# Patient Record
Sex: Female | Born: 1970 | Race: White | Hispanic: No | Smoking: Never smoker
Health system: Southern US, Community
[De-identification: ages and names within clinical notes are randomized; demographics above are authoritative.]

## PROBLEM LIST (undated history)

## (undated) DIAGNOSIS — R569 Unspecified convulsions: Secondary | ICD-10-CM

## (undated) DIAGNOSIS — F319 Bipolar disorder, unspecified: Secondary | ICD-10-CM

## (undated) HISTORY — PX: OTHER SURGICAL HISTORY: SHX169

## (undated) HISTORY — PX: ABDOMINAL HYSTERECTOMY: SHX81

## (undated) HISTORY — PX: CHOLECYSTECTOMY: SHX55

## (undated) HISTORY — PX: APPENDECTOMY: SHX54

## (undated) HISTORY — PX: OOPHORECTOMY: SHX86

---

## 2015-10-30 ENCOUNTER — Emergency Department
Admission: EM | Admit: 2015-10-30 | Discharge: 2015-10-30 | Disposition: A | Discharge status: Home / Self Care | Payer: BLUE CROSS/BLUE SHIELD | Attending: Student | Admitting: Student

## 2015-10-30 ENCOUNTER — Encounter: Payer: Self-pay | Admitting: Emergency Medicine

## 2015-10-30 ENCOUNTER — Emergency Department: Payer: BLUE CROSS/BLUE SHIELD

## 2015-10-30 DIAGNOSIS — N390 Urinary tract infection, site not specified: Secondary | ICD-10-CM | POA: Diagnosis not present

## 2015-10-30 DIAGNOSIS — R0781 Pleurodynia: Secondary | ICD-10-CM | POA: Insufficient documentation

## 2015-10-30 DIAGNOSIS — Z792 Long term (current) use of antibiotics: Secondary | ICD-10-CM | POA: Diagnosis not present

## 2015-10-30 DIAGNOSIS — R569 Unspecified convulsions: Secondary | ICD-10-CM | POA: Insufficient documentation

## 2015-10-30 DIAGNOSIS — F445 Conversion disorder with seizures or convulsions: Secondary | ICD-10-CM

## 2015-10-30 HISTORY — DX: Bipolar disorder, unspecified: F31.9

## 2015-10-30 HISTORY — DX: Unspecified convulsions: R56.9

## 2015-10-30 LAB — CBC WITH DIFFERENTIAL/PLATELET
BASOS ABS: 0.1 10*3/uL (ref 0–0.1)
BASOS PCT: 1 %
EOS ABS: 0.2 10*3/uL (ref 0–0.7)
EOS PCT: 2 %
HCT: 35.7 % (ref 35.0–47.0)
Hemoglobin: 12 g/dL (ref 12.0–16.0)
LYMPHS PCT: 24 %
Lymphs Abs: 2.3 10*3/uL (ref 1.0–3.6)
MCH: 29.7 pg (ref 26.0–34.0)
MCHC: 33.6 g/dL (ref 32.0–36.0)
MCV: 88.5 fL (ref 80.0–100.0)
Monocytes Absolute: 0.7 10*3/uL (ref 0.2–0.9)
Monocytes Relative: 7 %
Neutro Abs: 6.6 10*3/uL — ABNORMAL HIGH (ref 1.4–6.5)
Neutrophils Relative %: 66 %
PLATELETS: 329 10*3/uL (ref 150–440)
RBC: 4.03 MIL/uL (ref 3.80–5.20)
RDW: 14.5 % (ref 11.5–14.5)
WBC: 9.8 10*3/uL (ref 3.6–11.0)

## 2015-10-30 LAB — COMPREHENSIVE METABOLIC PANEL
ALBUMIN: 4.4 g/dL (ref 3.5–5.0)
ALT: 39 U/L (ref 14–54)
AST: 22 U/L (ref 15–41)
Alkaline Phosphatase: 158 U/L — ABNORMAL HIGH (ref 38–126)
Anion gap: 10 (ref 5–15)
BUN: 15 mg/dL (ref 6–20)
CHLORIDE: 106 mmol/L (ref 101–111)
CO2: 21 mmol/L — AB (ref 22–32)
CREATININE: 1.06 mg/dL — AB (ref 0.44–1.00)
Calcium: 10 mg/dL (ref 8.9–10.3)
GFR calc Af Amer: >60 mL/min (ref >60)
GFR calc non Af Amer: >60 mL/min (ref >60)
GLUCOSE: 108 mg/dL — AB (ref 65–99)
Potassium: 4 mmol/L (ref 3.5–5.1)
SODIUM: 137 mmol/L (ref 135–145)
Total Bilirubin: 0.2 mg/dL — ABNORMAL LOW (ref 0.3–1.2)
Total Protein: 7.5 g/dL (ref 6.5–8.1)

## 2015-10-30 LAB — URINALYSIS COMPLETE WITH MICROSCOPIC (ARMC ONLY)
Bilirubin Urine: NEGATIVE
Glucose, UA: NEGATIVE mg/dL
Ketones, ur: NEGATIVE mg/dL
Nitrite: NEGATIVE
PH: 6 (ref 5.0–8.0)
Protein, ur: 30 mg/dL — AB
SPECIFIC GRAVITY, URINE: 1.01 (ref 1.005–1.030)

## 2015-10-30 MED ORDER — TRAMADOL HCL 50 MG PO TABS: 50.0000 mg | ORAL_TABLET | Freq: Three times a day (TID) | ORAL | Status: AC | PRN

## 2015-10-30 MED ORDER — NITROFURANTOIN MONOHYD MACRO 100 MG PO CAPS
100.0000 mg | ORAL_CAPSULE | Freq: Once | ORAL | Status: AC
  Administered 2015-10-30: 100 mg via ORAL

## 2015-10-30 MED ORDER — NITROFURANTOIN MONOHYD MACRO 100 MG PO CAPS: 100.0000 mg | ORAL_CAPSULE | Freq: Two times a day (BID) | ORAL | Status: AC

## 2015-10-30 NOTE — ED Notes (Signed)
Mobile crisis person with her was called out because police found her disoriented sitting on a porch. States at that time was disoriented. Patient states she normally has a several hour post ictal time which is why she thinks she has a seizure.

## 2015-10-30 NOTE — ED Provider Notes (Addendum)
[facility] Emergency Department Provider Note  ____________________________________________  Time seen: Approximately 9:30 PM  I have reviewed the triage vital signs and the nursing notes.   HISTORY  Chief Complaint Seizures    HPI Isabella Gregory is a 45 y.o. 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 She reports that she has these episodes every few months and they are brought on by severe stress. She reports that she is under "the most stress I have ever been under". She reports she has had dysuria and is concerned she might have a urinary tract infection. No fevers or chills. No abdominal pain, vomiting or diarrhea. She reports that 2 weeks ago she suffered mechanical fall down a few steps and has been having right-sided chest pain which is been constant since then, is worse with movement. She is concerned for possible broken rib.   Past Medical History  Diagnosis Date  . Seizures (HCC)   . Bipolar 1 disorder (HCC)     There are no active problems to display for this patient.   Past Surgical History  Procedure Laterality Date  . Cholecystectomy    . Appendectomy    . Oophorectomy      bilateral  . Rectum resection    . Abdominal hysterectomy      Current Outpatient Rx  Name  Route  Sig  Dispense  Refill  . nitrofurantoin, macrocrystal-monohydrate, (MACROBID) 100 MG capsule   Oral   Take 1 capsule (100 mg total) by mouth 2 (two) times daily.   14 capsule   0   . traMADol (ULTRAM) 50 MG tablet   Oral   Take 1 tablet (50 mg total) by mouth every 8 (eight) hours as needed for severe pain.   6 tablet   0     Allergies Contrast media and Morphine and related  No family history on file.  Social  History Social History  Substance Use Topics  . Smoking status: Never Smoker   . Smokeless tobacco: None  . Alcohol Use: No    Review of Systems Constitutional: No fever/chills Eyes: No visual changes. ENT: No sore throat. Cardiovascular: Denies chest pain. Respiratory: Denies shortness of breath. Gastrointestinal: No abdominal pain.  No nausea, no vomiting.  No diarrhea.  No constipation. Genitourinary: Positive for dysuria. Musculoskeletal: Negative for back pain. Skin: Negative for rash. Neurological: Negative for headaches, focal weakness or numbness.  10-point ROS otherwise negative.  ____________________________________________   PHYSICAL EXAM:  VITAL SIGNS: ED Triage Vitals  Enc Vitals Group     BP 10/30/15 1726 110/76 mmHg     Pulse Rate 10/30/15 1726 109     Resp 10/30/15 1726 20     Temp 10/30/15 1726 98.3 F (36.8 C)     Temp Source 10/30/15 1726 Oral     SpO2 10/30/15 1726 97 %     Weight 10/30/15 1726 180 lb (81.647 kg)     Height 10/30/15 1726 5\' 6"  (1.676 m)     Head Cir --      Peak Flow --      Pain Score 10/30/15 1727 9     Pain Loc --      Pain Edu? --      Excl. in GC? --     Constitutional: Alert and oriented. Well appearing  and in no acute distress. Eyes: Conjunctivae are normal. PERRL. EOMI. Head: Atraumatic. Nose: No congestion/rhinnorhea. Mouth/Throat: Mucous membranes are moist.  Oropharynx non-erythematous. Neck: No stridor.   Cardiovascular: Normal rate, regular rhythm. Grossly normal heart sounds.  Good peripheral circulation. Respiratory: Normal respiratory effort.  No retractions. Lungs CTAB. Gastrointestinal: Soft and nontender. No distention.  No CVA tenderness. Genitourinary: deferred Musculoskeletal: No lower extremity tenderness nor edema.  No joint effusions. Result tenderness to palpation throat the right lateral chest wall. No bony step-off or deformity. No flail chest. No calf asymmetry/tenderness/edema. Neurologic:   Normal speech and language. No gross focal neurologic deficits are appreciated. No gait instability. 5 out of 5 strength in bilateral upper and lower extremities, sensation intact to light touch throughout. Skin:  Skin is warm, dry and intact. No rash noted. Psychiatric: Mood and affect are normal. Speech and behavior are normal.  ____________________________________________   LABS (all labs ordered are listed, but only abnormal results are displayed)  Labs Reviewed  CBC WITH DIFFERENTIAL/PLATELET - Abnormal; Notable for the following:    Neutro Abs 6.6 (*)    All other components within normal limits  COMPREHENSIVE METABOLIC PANEL - Abnormal; Notable for the following:    CO2 21 (*)    Glucose, Bld 108 (*)    Creatinine, Ser 1.06 (*)    Alkaline Phosphatase 158 (*)    Total Bilirubin 0.2 (*)    All other components within normal limits  URINALYSIS COMPLETEWITH MICROSCOPIC ([facility] ONLY) - Abnormal; Notable for the following:    Color, Urine YELLOW (*)    APPearance CLOUDY (*)    Hgb urine dipstick 1+ (*)    Protein, ur 30 (*)    Leukocytes, UA 3+ (*)    Bacteria, UA MANY (*)    Squamous Epithelial / LPF 6-30 (*)    All other components within normal limits  LAMOTRIGINE LEVEL   ____________________________________________  EKG  ED ECG REPORT I, Gayla Doss, the attending physician, personally viewed and interpreted this ECG.   Date: 10/30/2015  EKG Time: 21:24  Rate: 88  Rhythm: normal sinus rhythm  Axis: normal  Intervals:none  ST&T Change: No acute ST elevation. Normal QTC.  ____________________________________________  RADIOLOGY  CXR  IMPRESSION: No significant acute findings  ____________________________________________   PROCEDURES  Procedure(s) performed: None  Critical Care performed: No  ____________________________________________   INITIAL IMPRESSION / ASSESSMENT AND PLAN / ED COURSE  Pertinent labs & imaging results that were available  during my care of the patient were reviewed by me and considered in my medical decision making (see chart for details).  Isabella Gregory is a 45 y.o. 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 On exam, she is very well-appearing and in no acute distress, lying in bed and comfortably watching tv. She was mildly tachycardic on arrival to triage however that has resolved without any intervention at the time of my assessment. She has an intact neurological examination, she is alert and oriented 3. She does have reproducible right chest wall pain that she reports began when she fell down some steps 2 weeks ago. Chest x-ray shows no acute findings. EKG is reassuring. Doubt ACS. She denies any hemoptysis, leg swelling, use of oral contraceptives, recent surgeries, has no clinical evidence of DVT and her pain began in the setting of trauma so I doubt PE. Not consistent  with acute aortic dissection. CMP is notable for very mild creatinine elevation at 1.06. Patient encouraged to push by mouth fluids. She also has a mild nonspecific elevation of her alkaline phosphatase at 158. CMP unremarkable. Urinalysis is concerning for urinary tract infection. We'll start Macrobid. We discussed return precautions, need for close PCP follow-up and she is comfortable with the discharge plan. I discharged her with Macrobid as well as #6 tramadol tablets for breakthrough pain in the chest wall. DC home. ____________________________________________   FINAL CLINICAL IMPRESSION(S) / ED DIAGNOSES  Final diagnoses:  UTI (lower urinary tract infection)  Rib pain on right side  Pseudoseizure (HCC)      Gayla Doss, MD 10/30/15 2209  Gayla Doss, MD 10/30/15 2222

## 2015-10-30 NOTE — ED Notes (Signed)
Seizure today, history of seizures, has not had one in a year however under a lot of stress and lack of sleep which is a trigger for her. Also fell 2 weeks ago with chest wall pain.

## 2015-10-30 NOTE — Discharge Instructions (Signed)
If your neurologist told you that you have organic seizures, you should not drive until cleared to do so by your neurologist. Please take Macrobid as prescribed for urinary tract infection and follow up with your primary care doctor soon as possible. Return to the emergency department if you have seizure-like activity, fevers, abdominal pain, worsening chest pain or change in chest pain, difficulty breathing, cough, fevers, or for any other concerns.

## 2015-10-31 LAB — LAMOTRIGINE LEVEL: LAMOTRIGINE LVL: NOT DETECTED ug/mL (ref 2.0–20.0)

## 2017-01-05 IMAGING — CR DG CHEST 2V
1 series · 2 of 2 positions shown · non-contrast
Comparison: None.

CLINICAL DATA: Fall, chest pain

EXAM:
CHEST  2 VIEW

[Series 1: w chest pa · 0.14mm/px · 2 of 2 slices shown]
[im 1/2]
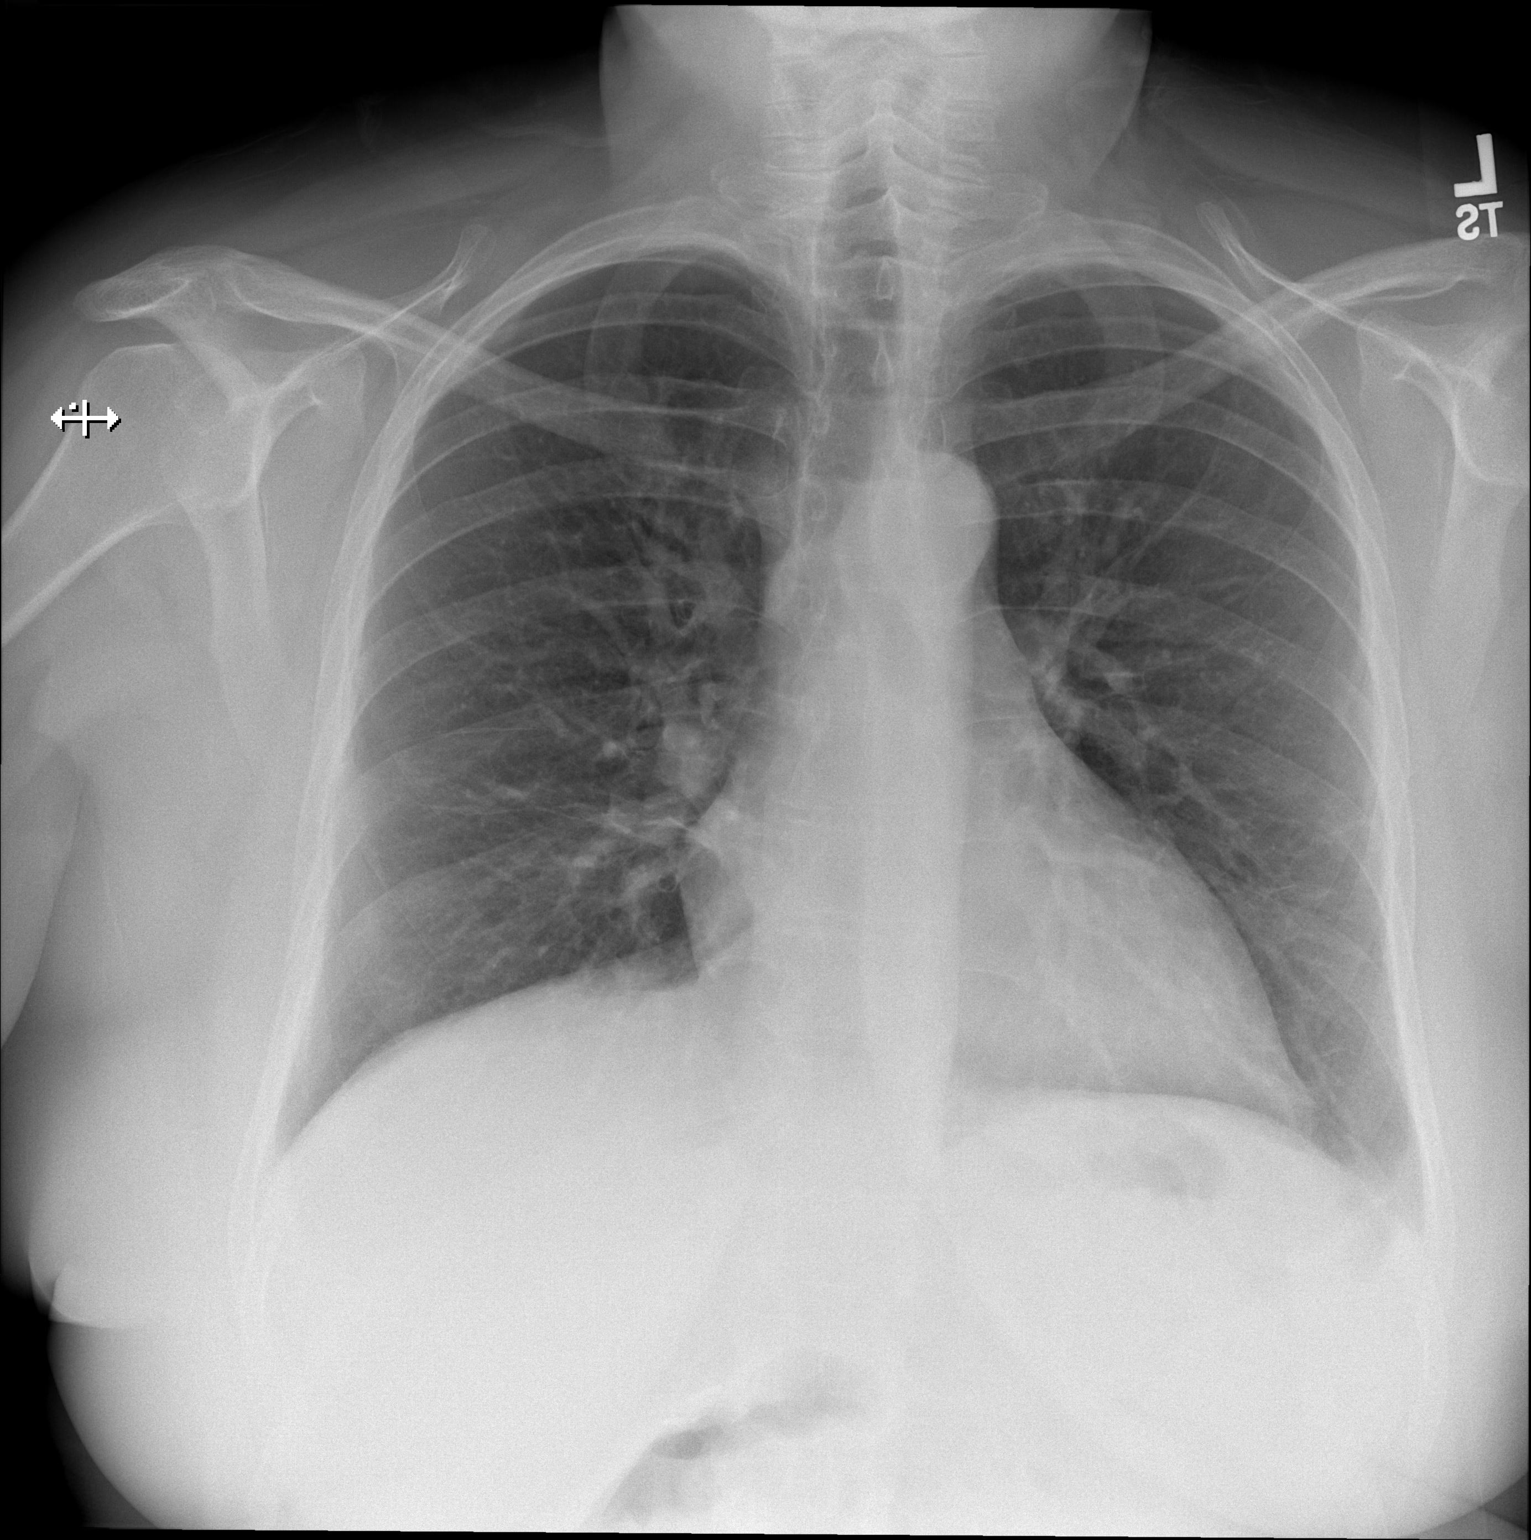
[im 2/2]
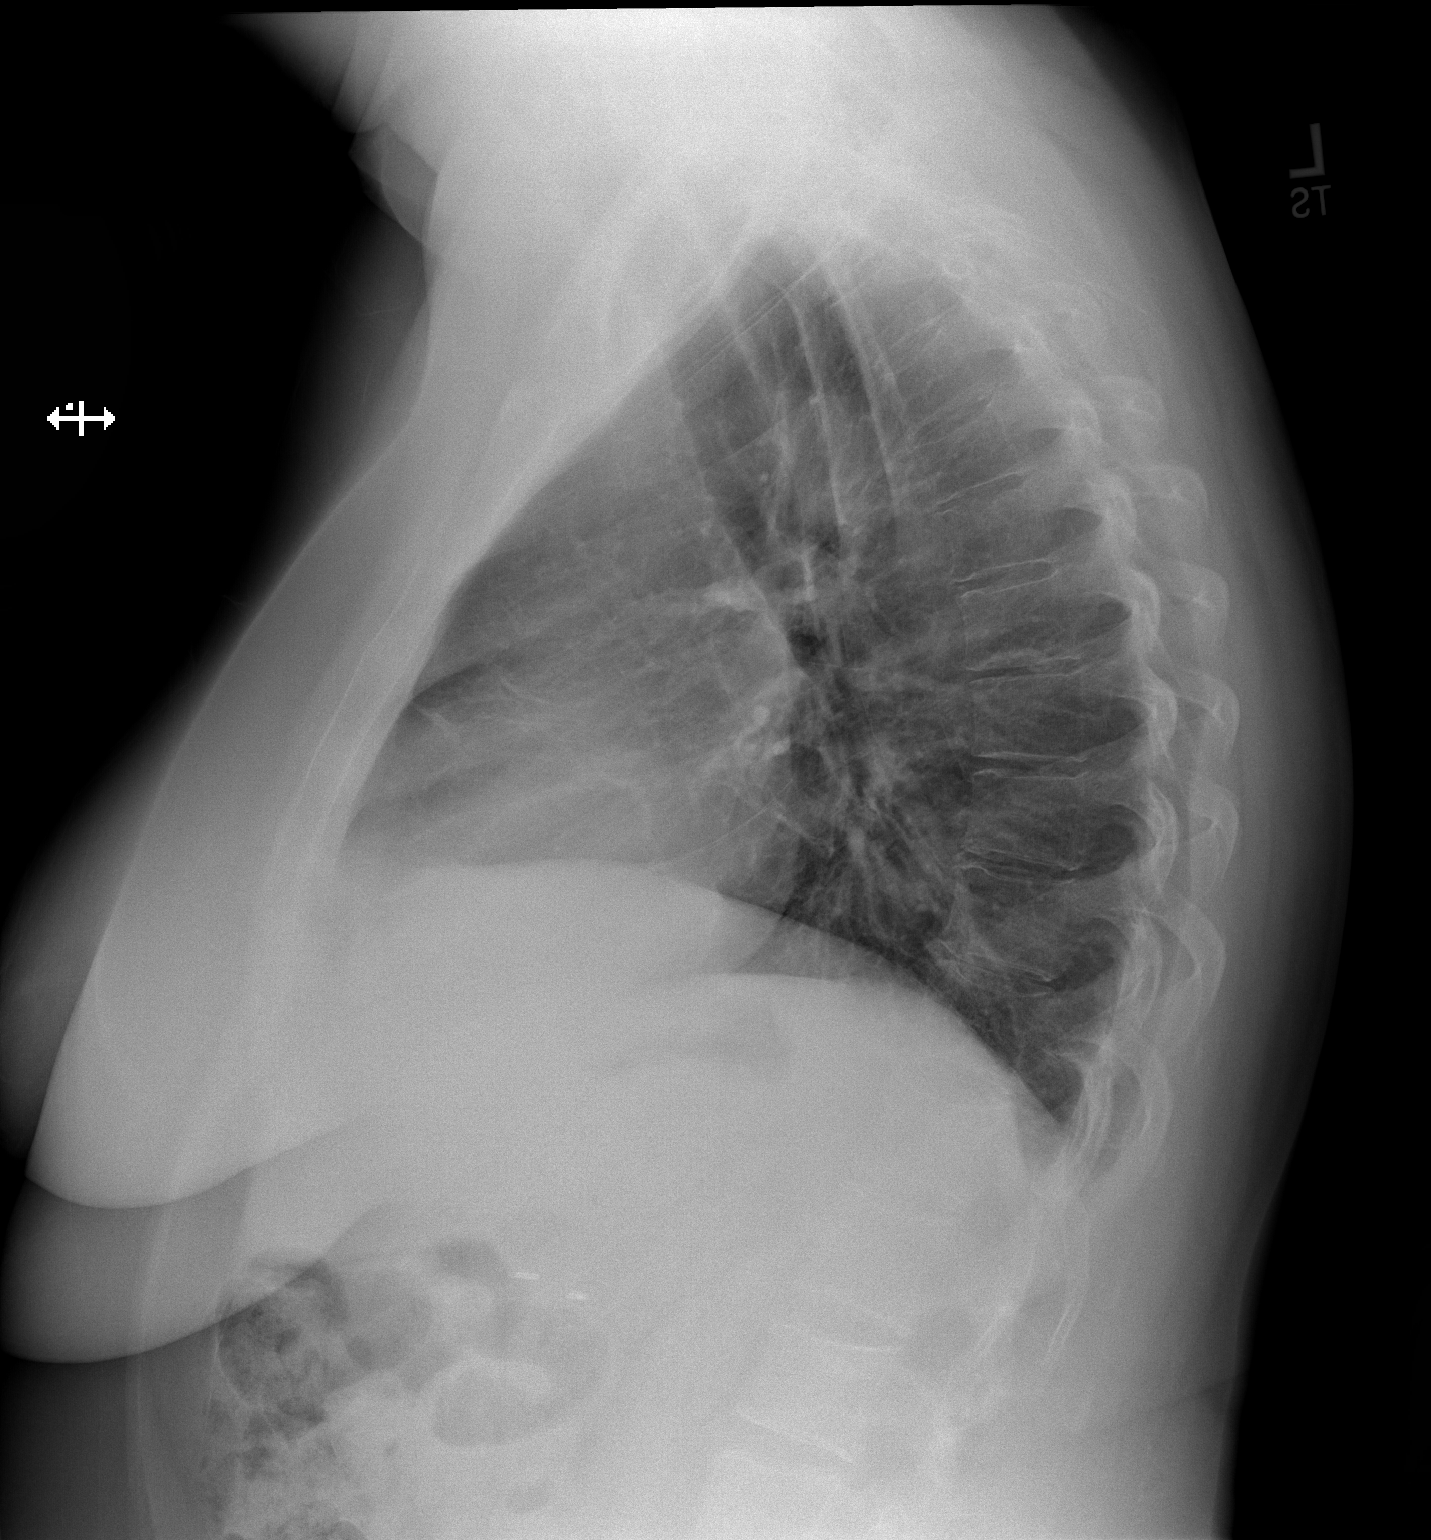

[2 of 2 positions shown; findings below may reference images not displayed]

FINDINGS: Heart size mildly enlarged. Vascular pattern normal. No
pneumothorax. Possible trace left pleural effusion versus lateral
lung base atelectasis. Mild bilateral lower lobe atelectasis. Lungs
otherwise clear.
IMPRESSION: No significant acute findings
